# Patient Record
Sex: Male | Born: 1980 | Race: Black or African American | Hispanic: No | Marital: Married | Smoking: Never smoker
Health system: Southern US, Community
[De-identification: ages and names within clinical notes are randomized; demographics above are authoritative.]

---

## 2015-03-05 ENCOUNTER — Other Ambulatory Visit: Payer: Self-pay

## 2015-03-05 ENCOUNTER — Encounter (HOSPITAL_COMMUNITY): Payer: Self-pay

## 2015-03-05 ENCOUNTER — Emergency Department (HOSPITAL_COMMUNITY)
Admission: EM | Admit: 2015-03-05 | Discharge: 2015-03-05 | Disposition: A | Discharge status: Home / Self Care | Payer: Self-pay | Attending: Emergency Medicine | Admitting: Emergency Medicine

## 2015-03-05 ENCOUNTER — Emergency Department (HOSPITAL_COMMUNITY): Payer: Self-pay

## 2015-03-05 DIAGNOSIS — E860 Dehydration: Secondary | ICD-10-CM | POA: Insufficient documentation

## 2015-03-05 DIAGNOSIS — M791 Myalgia: Secondary | ICD-10-CM | POA: Insufficient documentation

## 2015-03-05 LAB — BASIC METABOLIC PANEL
Anion gap: 8 (ref 5–15)
BUN: 14 mg/dL (ref 6–20)
CO2: 23 mmol/L (ref 22–32)
Calcium: 9.2 mg/dL (ref 8.9–10.3)
Chloride: 104 mmol/L (ref 101–111)
Creatinine, Ser: 1.68 mg/dL — ABNORMAL HIGH (ref 0.61–1.24)
GFR calc Af Amer: 59 mL/min — ABNORMAL LOW (ref >60)
GFR calc non Af Amer: 51 mL/min — ABNORMAL LOW (ref >60)
Glucose, Bld: 93 mg/dL (ref 65–99)
Potassium: 4 mmol/L (ref 3.5–5.1)
Sodium: 135 mmol/L (ref 135–145)

## 2015-03-05 LAB — CBC WITH DIFFERENTIAL/PLATELET
Basophils Absolute: 0 10*3/uL (ref 0.0–0.1)
Basophils Relative: 0 %
Eosinophils Absolute: 0 10*3/uL (ref 0.0–0.7)
Eosinophils Relative: 0 %
HCT: 44 % (ref 39.0–52.0)
Hemoglobin: 15.2 g/dL (ref 13.0–17.0)
Lymphocytes Relative: 7 %
Lymphs Abs: 0.8 10*3/uL (ref 0.7–4.0)
MCH: 29.7 pg (ref 26.0–34.0)
MCHC: 34.5 g/dL (ref 30.0–36.0)
MCV: 86.1 fL (ref 78.0–100.0)
Monocytes Absolute: 0.5 10*3/uL (ref 0.1–1.0)
Monocytes Relative: 4 %
Neutro Abs: 9.8 10*3/uL — ABNORMAL HIGH (ref 1.7–7.7)
Neutrophils Relative %: 89 %
Platelets: 198 10*3/uL (ref 150–400)
RBC: 5.11 MIL/uL (ref 4.22–5.81)
RDW: 11.9 % (ref 11.5–15.5)
WBC: 11.1 10*3/uL — ABNORMAL HIGH (ref 4.0–10.5)

## 2015-03-05 LAB — URINALYSIS, ROUTINE W REFLEX MICROSCOPIC
Bilirubin Urine: NEGATIVE
Glucose, UA: NEGATIVE mg/dL
Ketones, ur: NEGATIVE mg/dL
Leukocytes, UA: NEGATIVE
Nitrite: NEGATIVE
Protein, ur: 100 mg/dL — AB
Specific Gravity, Urine: 1.012 (ref 1.005–1.030)
Urobilinogen, UA: 1 mg/dL (ref 0.0–1.0)
pH: 6 (ref 5.0–8.0)

## 2015-03-05 LAB — URINE MICROSCOPIC-ADD ON

## 2015-03-05 LAB — CK: Total CK: 224 U/L (ref 49–397)

## 2015-03-05 MED ORDER — SODIUM CHLORIDE 0.9 % IV BOLUS (SEPSIS)
1000.0000 mL | Freq: Once | INTRAVENOUS | Status: AC
  Administered 2015-03-05: 1000 mL via INTRAVENOUS

## 2015-03-05 NOTE — Discharge Instructions (Signed)
Return here as needed.  Increase her fluid intake, rest as much as possible.  Follow-up with a  primary care doctor °

## 2015-03-05 NOTE — ED Provider Notes (Signed)
CSN: [id number]     Arrival date & time 03/05/15  1106 History   First MD Initiated Contact with Patient 03/05/15 1131     Chief Complaint  Patient presents with  . Shortness of Breath  . Muscle Pain     (Consider location/radiation/quality/duration/timing/severity/associated sxs/prior Treatment) HPI Patient presents to the emergency department with SOB and muscle cramps. 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 He developed SOB, muscle cramps and was evaluated on on the scene and noted to have low blood pressure and "low oxygen" according to the patient. He denies, chest pain, loss of consciousness, numbness or tingling. 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 He states that he does not feel like he drank enough water this morning and he feels like he "did everything wrong" to prepare for this test. The patient's was diagnosed with diabetes last year but modified his diet and is currently maintaining his blood sugar in a normal range. He has no personal history of CVD and no family hx of SCD or MI.  Patient denies chest pain, dizziness, weakness, headache, blurred vision, back pain, neck pain, fever, cough, runny nose, sore throat, dysuria, incontinence, bloody stool, hematemesis, or syncope.  The patient states that nothing seems make his condition, better or worse  History reviewed. No pertinent past medical history. History reviewed. No pertinent past surgical history. History reviewed. No pertinent family history. Social History  Substance Use Topics  . Smoking status: Never Smoker   . Smokeless tobacco: None  . Alcohol Use: No    Review of Systems  All other systems negative except as documented in the HPI. All pertinent positives and negatives as reviewed in the HPI.  Allergies  Review of patient's allergies indicates no known allergies.  Home  Medications   Prior to Admission medications   Not on File   BP 124/72 mmHg  Pulse 64  Temp(Src) 97.6 F (36.4 C) (Oral)  Resp 20  SpO2 97% Physical Exam  Constitutional: He is oriented to person, place, and time. He appears well-developed and well-nourished. No distress.  HENT:  Head: Normocephalic and atraumatic.  Mouth/Throat: Oropharynx is clear and moist.  Eyes: Pupils are equal, round, and reactive to light.  Neck: Normal range of motion. Neck supple.  Cardiovascular: Normal rate, regular rhythm, normal heart sounds and intact distal pulses.  Exam reveals no gallop and no friction rub.   No murmur heard. Pulmonary/Chest: Effort normal. He has no rales. He exhibits no tenderness.  Wheeze in right lower lobe and base. Clear to auscultation in left lung and right upper lobe.  Neurological: He is alert and oriented to person, place, and time.  Skin: Skin is warm and dry. No rash noted. No erythema.  Psychiatric: He has a normal mood and affect.  Nursing note and vitals reviewed.   ED Course  Procedures (including critical care time)  2:10pm - Patient is lying in bed comfortably and has no complaints. He understands the plan to continue to receive IV fluids as pt is dehydrated.  Labs Review Labs Reviewed  BASIC METABOLIC PANEL - Abnormal; Notable for the following:    Creatinine, Ser 1.68 (*)    GFR calc non Af Amer 51 (*)    GFR calc Af Amer 59 (*)    All other components within normal limits  CBC WITH DIFFERENTIAL/PLATELET - Abnormal; Notable  for the following:    WBC 11.1 (*)    Neutro Abs 9.8 (*)    All other components within normal limits  URINALYSIS, ROUTINE W REFLEX MICROSCOPIC (NOT AT [facility]) - Abnormal; Notable for the following:    APPearance CLOUDY (*)    Hgb urine dipstick TRACE (*)    Protein, ur 100 (*)    All other components within normal limits  URINE MICROSCOPIC-ADD ON - Abnormal; Notable for the following:    Bacteria, UA FEW (*)    Casts HYALINE  CASTS (*)    All other components within normal limits  CK    Imaging Review Dg Chest 2 View  03/05/2015   CLINICAL DATA:  Leg numbness and wheezing. Nonsmoker. Initial encounter.  EXAM: CHEST  2 VIEW  COMPARISON:  None.  FINDINGS: The heart size and mediastinal contours are normal. The lungs are clear. There is no pleural effusion or pneumothorax. No acute osseous findings are identified.  IMPRESSION: No active cardiopulmonary process.   Electronically Signed   By: Carey Bullocks M.D.   On: 03/05/2015 13:04   I have personally reviewed and evaluated these images and lab results as part of my medical decision-making.   EKG Interpretation   Date/Time:  Wednesday March 05 2015 11:06:01 EDT Ventricular Rate:  89 PR Interval:  180 QRS Duration: 80 QT Interval:  348 QTC Calculation: 423 R Axis:   78 Text Interpretation:  Sinus tachycardia Ventricular premature complex  Baseline wander in lead(s) V3 Confirmed by Rubin Payor  MD, Harrold Donath [phone])  on 03/05/2015 11:10:26 AM      Patient is feeling vastly improved following IV fluids.  Patient is advised to increase his fluid intake, rest as much possible.  Patient agrees the plan and all questions were answered.  Patient's vital signs remained stable.  I do feel the patient has dehydration from the strenuous activity that he was performing and lack of sleep and eating and drinking over the last few days   Charlestine Night, PA-C 03/05/15 1552  Benjiman Core, MD 03/07/15 1444

## 2015-03-05 NOTE — ED Notes (Signed)
Family at bedside. 

## 2015-03-05 NOTE — ED Notes (Signed)
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 Pt became short of breath and had muscle cramps after the test. Pt initially hypotensive and was therefore transported by EMS. Pt is a&o X4. Denies pain on arrival. No distress noted, respirations equal and unlabored.

## 2015-03-05 NOTE — ED Notes (Signed)
Patient up ambulatory to the bathroom with no difficulty or distress, wife walking with patient

## 2016-06-06 IMAGING — DX DG CHEST 2V
2 series · 2 of 2 positions shown · non-contrast
Comparison: None.

CLINICAL DATA: Leg numbness and wheezing. Nonsmoker. Initial
encounter.

EXAM:
CHEST  2 VIEW

[chest pa]
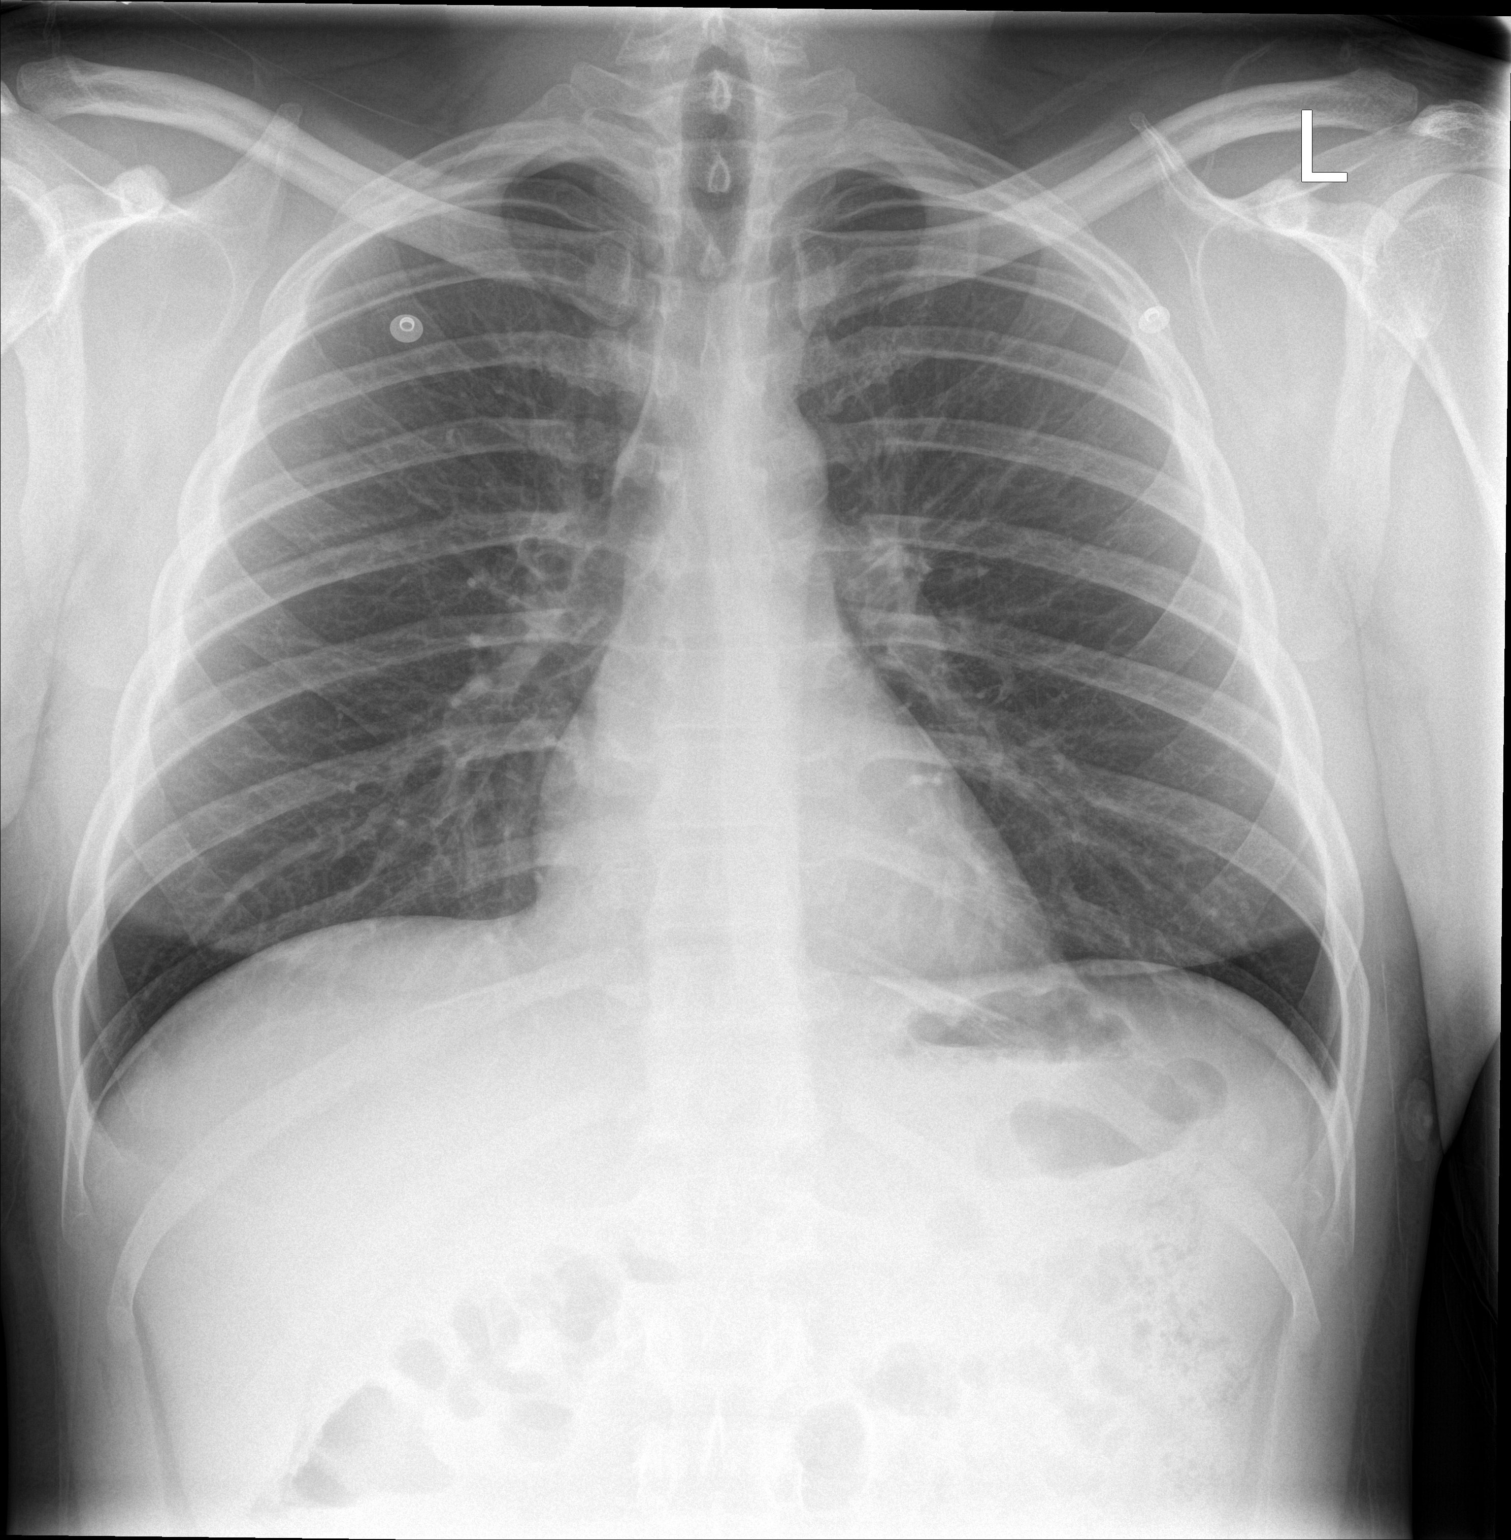

[chest lat]
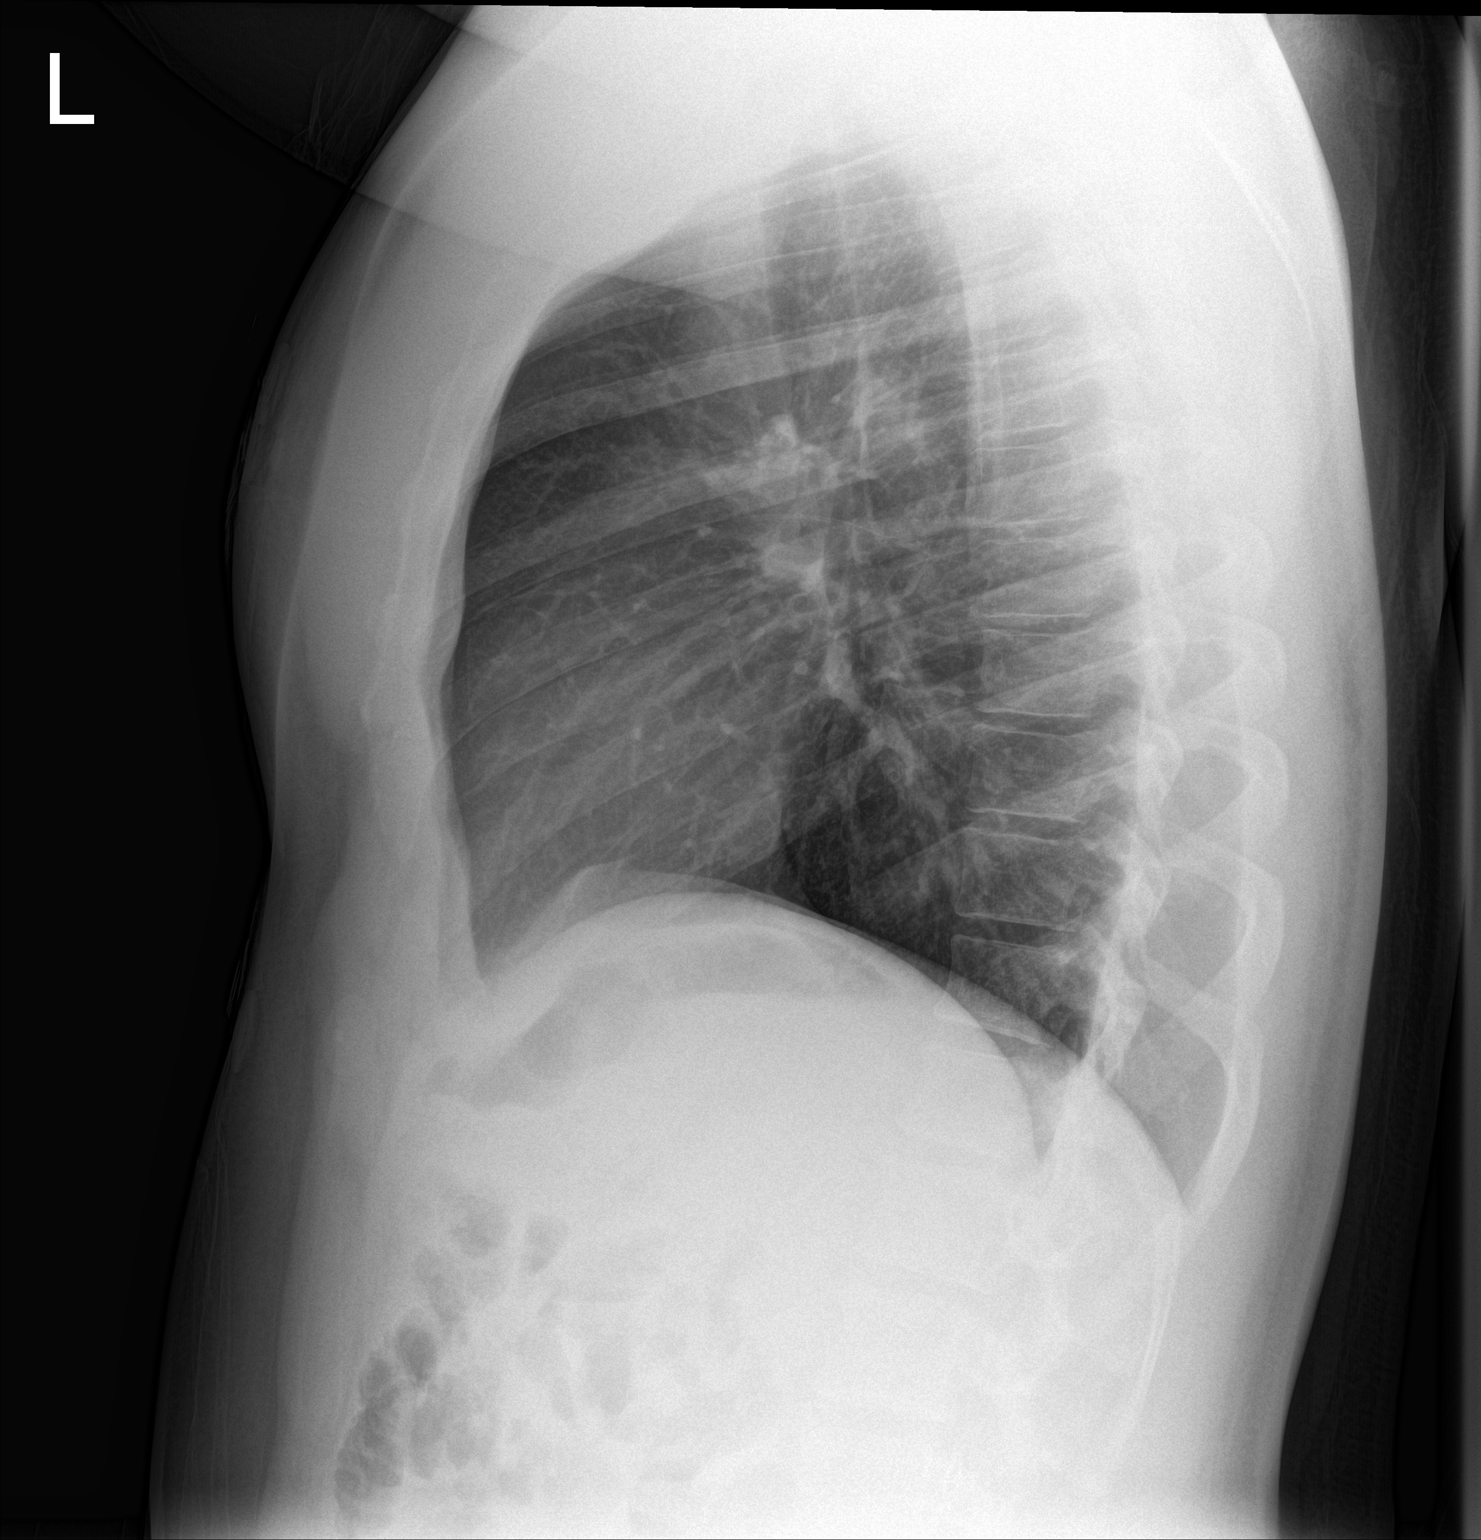

[2 of 2 positions shown; findings below may reference images not displayed]

FINDINGS: The heart size and mediastinal contours are normal. The lungs are
clear. There is no pleural effusion or pneumothorax. No acute
osseous findings are identified.
IMPRESSION: No active cardiopulmonary process.

## 2018-01-05 ENCOUNTER — Emergency Department
Admission: EM | Admit: 2018-01-05 | Discharge: 2018-01-05 | Disposition: A | Discharge status: Home / Self Care | Payer: BLUE CROSS/BLUE SHIELD | Attending: Emergency Medicine | Admitting: Emergency Medicine

## 2018-01-05 ENCOUNTER — Other Ambulatory Visit: Payer: Self-pay

## 2018-01-05 DIAGNOSIS — E860 Dehydration: Secondary | ICD-10-CM

## 2018-01-05 DIAGNOSIS — E1165 Type 2 diabetes mellitus with hyperglycemia: Secondary | ICD-10-CM | POA: Insufficient documentation

## 2018-01-05 DIAGNOSIS — E119 Type 2 diabetes mellitus without complications: Secondary | ICD-10-CM

## 2018-01-05 DIAGNOSIS — R531 Weakness: Secondary | ICD-10-CM | POA: Diagnosis present

## 2018-01-05 LAB — URINALYSIS, ROUTINE W REFLEX MICROSCOPIC
Bacteria, UA: NONE SEEN
Bilirubin Urine: NEGATIVE
HGB URINE DIPSTICK: NEGATIVE
Ketones, ur: 5 mg/dL — AB
Leukocytes, UA: NEGATIVE
Nitrite: NEGATIVE
PROTEIN: NEGATIVE mg/dL
SQUAMOUS EPITHELIAL / LPF: NONE SEEN (ref 0–5)
Specific Gravity, Urine: 1.006 (ref 1.005–1.030)
pH: 6 (ref 5.0–8.0)

## 2018-01-05 LAB — GLUCOSE, CAPILLARY: GLUCOSE-CAPILLARY: 208 mg/dL — AB (ref 70–99)

## 2018-01-05 LAB — CBC
HEMATOCRIT: 42 % (ref 40.0–52.0)
HEMOGLOBIN: 14.5 g/dL (ref 13.0–18.0)
MCH: 30.1 pg (ref 26.0–34.0)
MCHC: 34.5 g/dL (ref 32.0–36.0)
MCV: 87.5 fL (ref 80.0–100.0)
Platelets: 186 10*3/uL (ref 150–440)
RBC: 4.8 MIL/uL (ref 4.40–5.90)
RDW: 12.9 % (ref 11.5–14.5)
WBC: 4.8 10*3/uL (ref 3.8–10.6)

## 2018-01-05 LAB — COMPREHENSIVE METABOLIC PANEL
ALBUMIN: 4.7 g/dL (ref 3.5–5.0)
ALK PHOS: 81 U/L (ref 38–126)
ALT: 24 U/L (ref 0–44)
AST: 31 U/L (ref 15–41)
Anion gap: 12 (ref 5–15)
BUN: 19 mg/dL (ref 6–20)
CO2: 24 mmol/L (ref 22–32)
Calcium: 9.6 mg/dL (ref 8.9–10.3)
Chloride: 99 mmol/L (ref 98–111)
Creatinine, Ser: 1.3 mg/dL — ABNORMAL HIGH (ref 0.61–1.24)
GFR calc Af Amer: >60 mL/min (ref >60)
GFR calc non Af Amer: >60 mL/min (ref >60)
GLUCOSE: 276 mg/dL — AB (ref 70–99)
Potassium: 3.4 mmol/L — ABNORMAL LOW (ref 3.5–5.1)
SODIUM: 135 mmol/L (ref 135–145)
TOTAL PROTEIN: 8.2 g/dL — AB (ref 6.5–8.1)
Total Bilirubin: 1 mg/dL (ref 0.3–1.2)

## 2018-01-05 LAB — TROPONIN I: Troponin I: <0.03 ng/mL (ref <0.03)

## 2018-01-05 LAB — CK: CK TOTAL: 194 U/L (ref 49–397)

## 2018-01-05 MED ORDER — SODIUM CHLORIDE 0.9 % IV BOLUS
1000.0000 mL | Freq: Once | INTRAVENOUS | Status: AC
  Administered 2018-01-05: 1000 mL via INTRAVENOUS

## 2018-01-05 MED ORDER — BLOOD GLUCOSE MONITOR KIT
PACK | 0 refills | Status: AC
Start: 2018-01-05 — End: ?

## 2018-01-05 MED ORDER — INSULIN ASPART 100 UNIT/ML ~~LOC~~ SOLN
4.0000 [IU] | Freq: Once | SUBCUTANEOUS | Status: AC
  Administered 2018-01-05: 4 [IU] via INTRAVENOUS
  Filled 2018-01-05: qty 1

## 2018-01-05 MED ORDER — METFORMIN HCL 500 MG PO TABS: 500.0000 mg | ORAL_TABLET | Freq: Two times a day (BID) | ORAL | 1 refills | Status: AC

## 2018-01-05 NOTE — ED Provider Notes (Signed)
[facility] Emergency Department Provider Note  Time seen: 9:17 AM  I have reviewed the triage vital signs and the nursing notes.   HISTORY  Chief Complaint Weakness    HPI Lennart PallMicheal Cea is a 37 y.o. male with no past medical history who presents to the emergency department for lightheadedness.  According to the patient he was driving home from work this morning when he was feeling lightheaded like he might pass out.  States he pulled over at a rest stop, tried to wait a while but continued to feel lightheaded so he called EMS.  Patient thinks he might be dehydrated.  States he worked out very hard earlier in the day yesterday and thinks he might have overdone it.  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  Patient states he has been urinating including proximally an hour and a half ago, denies any dark urine.  Largely negative review of systems including fever, nausea, vomiting, chest pain, abdominal pain, dysuria.   No past medical history on file.  There are no active problems to display for this patient.   No past surgical history on file.  Prior to Admission medications   Not on File    No Known Allergies  No family history on file.  Social History Social History   Tobacco Use  . Smoking status: Never Smoker  Substance Use Topics  . Alcohol use: No  . Drug use: Not on file    Review of Systems Constitutional: Negative for fever.  Lightheadedness, now resolved Cardiovascular: Negative for chest pain. Respiratory: Negative for shortness of breath. Gastrointestinal: Negative for abdominal pain, vomiting and diarrhea. Genitourinary: Negative for urinary compaints Musculoskeletal: Negative for musculoskeletal complaints Skin: Negative for skin complaints  Neurological: Negative for headache All other ROS negative  ____________________________________________   PHYSICAL EXAM:  VITAL SIGNS: ED Triage  Vitals  Enc Vitals Group     BP --      Pulse --      Resp --      Temp --      Temp src --      SpO2 01/05/18 0915 100 %     Weight 01/05/18 0916 210 lb (95.3 kg)     Height 01/05/18 0916 5\' 10"  (1.778 m)     Head Circumference --      Peak Flow --      Pain Score 01/05/18 0916 0     Pain Loc --      Pain Edu? --      Excl. in GC? --     Constitutional: Alert and oriented. Well appearing and in no distress. Eyes: Normal exam ENT   Head: Normocephalic and atraumatic.   Mouth/Throat: Mucous membranes are moist. Cardiovascular: Normal rate, regular rhythm. No murmur Respiratory: Normal respiratory effort without tachypnea nor retractions. Breath sounds are clear  Gastrointestinal: Soft and nontender. No distention.   Musculoskeletal: Nontender with normal range of motion in all extremities. No lower extremity tenderness or edema. Neurologic:  Normal speech and language. No gross focal neurologic deficits  Skin:  Skin is warm, dry and intact.  Psychiatric: Mood and affect are normal.   ____________________________________________    EKG  EKG reviewed and interpreted by myself shows normal sinus rhythm at 63 bpm with a narrow QRS, normal axis, normal intervals, no concerning ST changes  ____________________________________________   INITIAL IMPRESSION / ASSESSMENT AND PLAN / ED COURSE  Pertinent labs & imaging  results that were available during my care of the patient were reviewed by me and considered in my medical decision making (see chart for details).  Patient presents to the emergency department for generalized weakness, lightheadedness this morning.  Differential would include dehydration, electrolyte or metabolic abnormality, arrhythmia, ACS.  We will check labs including cardiac enzymes, EKG, we will IV hydrate and continue to closely monitor.  Patient agreeable to plan of care.  Patient is labs have resulted showing elevated blood glucose of 270.  Patient  states he was taking diabetic medications at one point but they took him off these medications 2 years ago.  Is not currently taking any medications.  Patient states he has not eaten or drinking anything today besides water.  I suspect the patient's dehydration is related to hyperglycemia and untreated diabetes.  We will dose a very low dose of insulin in the emergency department, IV hydrate.  We will have the patient follow-up with his doctor and place patient on metformin prior to discharge.  Patient agreeable to plan of care.  Patient's blood glucose is down to 208.  Patient is feeling better.  We will continue with IV fluids and discharge once his IV fluids have completed.  ____________________________________________   FINAL CLINICAL IMPRESSION(S) / ED DIAGNOSES  Weakness Hyperglycemia Diabetes   Minna Antis, MD 01/05/18 1153

## 2018-01-05 NOTE — Discharge Instructions (Addendum)
Please follow-up with your primary care doctor within the next 1 to 2 weeks for recheck/reevaluation.  Please begin taking your metformin twice daily as prescribed.  Please begin checking your blood glucose several times daily to ensure that it does not remain elevated.  Return to the emergency department for any personally concerning symptoms.

## 2018-01-05 NOTE — ED Triage Notes (Signed)
Pt driving home from work when he pulled over at a rest stop due to feeling weak. 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 Pt reports feeling as if he is going to pass out.
